# Patient Record
Sex: Female | Born: 1985 | Race: White | Hispanic: No
Health system: Southern US, Community
[De-identification: ages and names within clinical notes are randomized; demographics above are authoritative.]

## PROBLEM LIST (undated history)

## (undated) DIAGNOSIS — F329 Major depressive disorder, single episode, unspecified: Secondary | ICD-10-CM

## (undated) DIAGNOSIS — G4700 Insomnia, unspecified: Secondary | ICD-10-CM

## (undated) DIAGNOSIS — F32A Depression, unspecified: Secondary | ICD-10-CM

## (undated) DIAGNOSIS — E119 Type 2 diabetes mellitus without complications: Secondary | ICD-10-CM

## (undated) DIAGNOSIS — F419 Anxiety disorder, unspecified: Secondary | ICD-10-CM

## (undated) DIAGNOSIS — M5412 Radiculopathy, cervical region: Secondary | ICD-10-CM

## (undated) HISTORY — DX: Insomnia, unspecified: G47.00

## (undated) HISTORY — DX: Anxiety disorder, unspecified: F41.9

## (undated) HISTORY — DX: Depression, unspecified: F32.A

## (undated) HISTORY — DX: Type 2 diabetes mellitus without complications: E11.9

## (undated) HISTORY — DX: Radiculopathy, cervical region: M54.12

---
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
## 2018-07-07 ENCOUNTER — Other Ambulatory Visit: Payer: Self-pay | Admitting: Orthopedic Surgery

## 2018-07-07 ENCOUNTER — Other Ambulatory Visit: Payer: Self-pay | Admitting: Dermatology

## 2018-07-07 ENCOUNTER — Other Ambulatory Visit: Payer: Self-pay | Admitting: Ophthalmology

## 2018-07-07 DIAGNOSIS — M545 Low back pain, unspecified: Secondary | ICD-10-CM

## 2018-07-07 DIAGNOSIS — M5416 Radiculopathy, lumbar region: Secondary | ICD-10-CM

## 2018-07-09 ENCOUNTER — Ambulatory Visit
Admission: RE | Admit: 2018-07-09 | Discharge: 2018-07-09 | Disposition: A | Discharge status: Home / Self Care | Payer: BLUE CROSS/BLUE SHIELD | Source: Ambulatory Visit | Attending: Orthopedic Surgery | Admitting: Orthopedic Surgery

## 2018-07-09 ENCOUNTER — Other Ambulatory Visit: Payer: Self-pay

## 2018-07-09 DIAGNOSIS — M545 Low back pain, unspecified: Secondary | ICD-10-CM

## 2018-07-09 DIAGNOSIS — M5416 Radiculopathy, lumbar region: Secondary | ICD-10-CM

## 2018-07-11 ENCOUNTER — Other Ambulatory Visit: Payer: Self-pay

## 2018-10-15 ENCOUNTER — Ambulatory Visit: Payer: BLUE CROSS/BLUE SHIELD | Admitting: Neurology

## 2018-10-15 ENCOUNTER — Telehealth: Payer: Self-pay

## 2018-10-15 NOTE — Telephone Encounter (Signed)
Pt did not show for their appt with Dr. Athar today.  

## 2018-10-16 ENCOUNTER — Encounter: Payer: Self-pay | Admitting: Neurology

## 2019-09-26 IMAGING — MR MRI LUMBAR SPINE WITHOUT CONTRAST
4 of 5 series · 24 of 48 positions shown · non-contrast
Comparison: Prior radiograph from 05/22/2018 as well as previous
MRI from 04/08/2015

CLINICAL DATA: Initial evaluation for chronic low back pain with
radiation into the left lower extremity for 1 year.

EXAM:
MRI LUMBAR SPINE WITHOUT CONTRAST
TECHNIQUE: Multiplanar, multisequence MR imaging of the lumbar spine was
performed. No intravenous contrast was administered.

[Series 3: T2 · sagittal · 4.0mm · 0.55mm/px · 5 of 13 slices shown (1 of 2)]
[im 1/13]
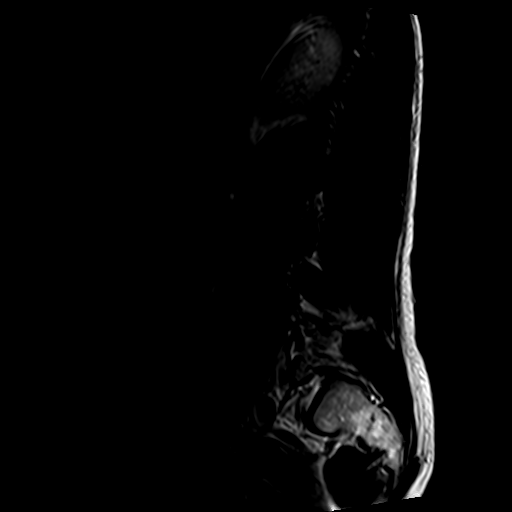
[im 4/13]
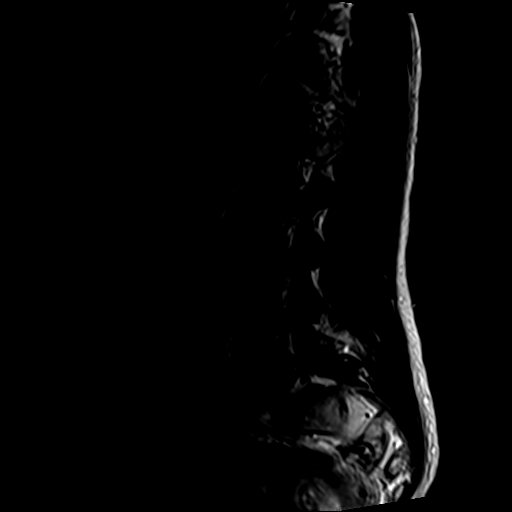
[im 7/13]
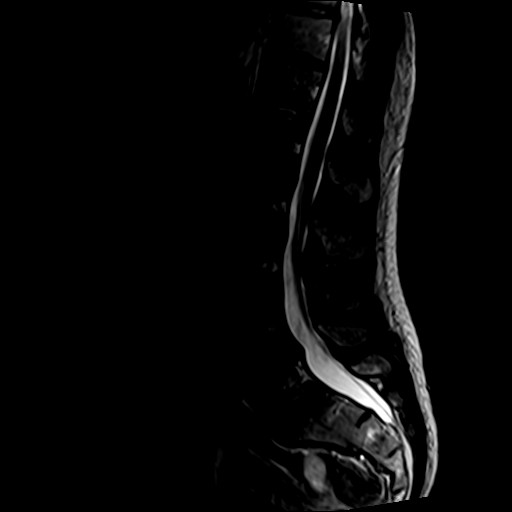
[im 10/13]
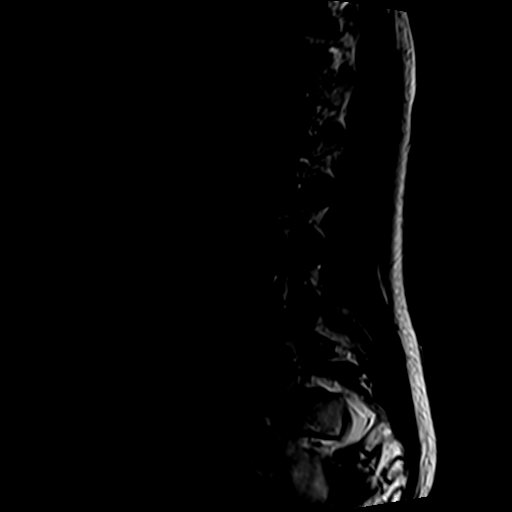
[im 13/13]
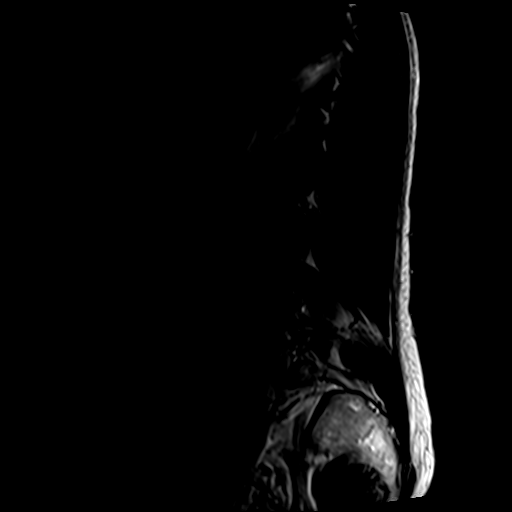

[Series 5: T1 · sagittal · 4.0mm · 0.55mm/px · 6 of 13 slices shown (1 of 2)]
[im 1/13]
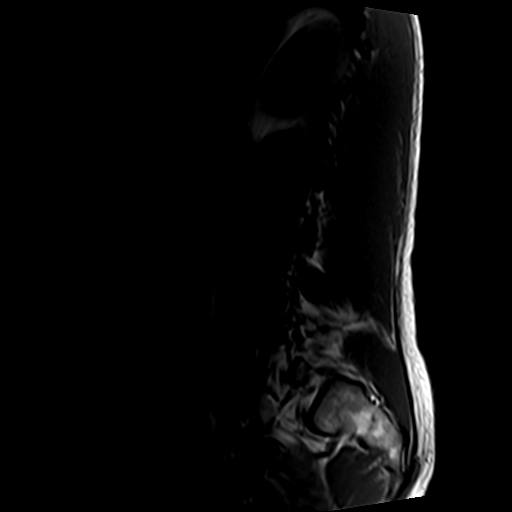
[im 3/13]
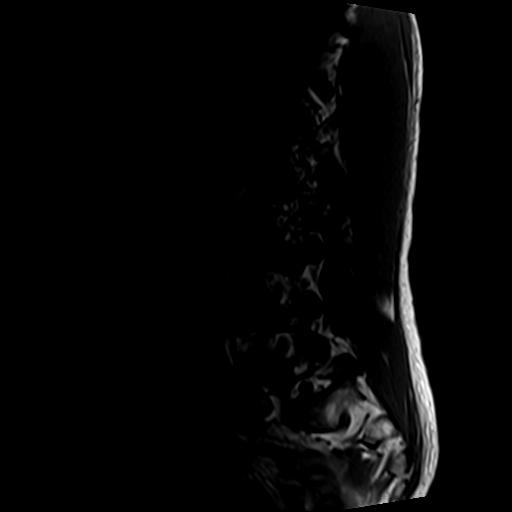
[im 5/13]
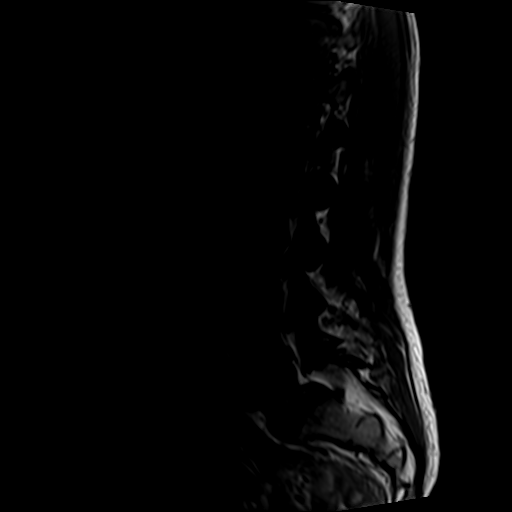
[im 8/13]
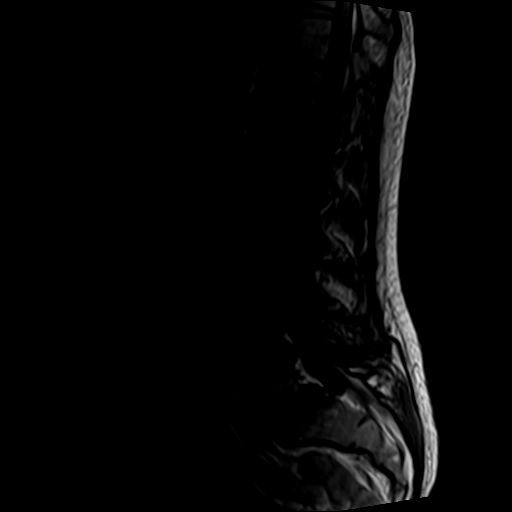
[im 10/13]
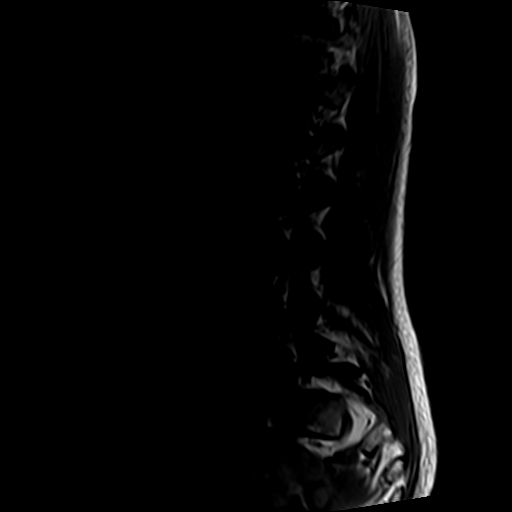
[im 13/13]
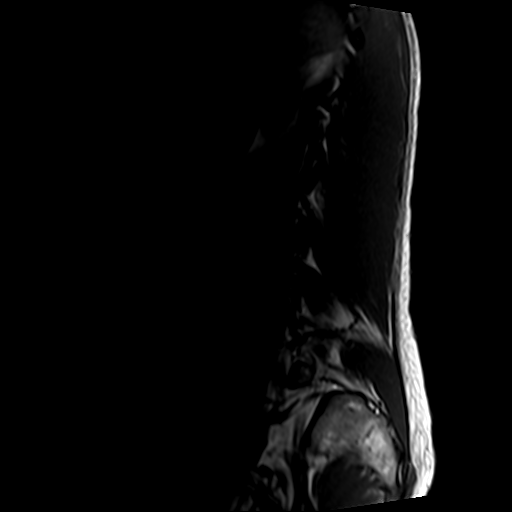

[Series 6: T2 · axial · 4.0mm · 0.70mm/px · z∈[-78,+136]mm · 10 of 36 slices shown (2 of 2)]
[im 3/36]
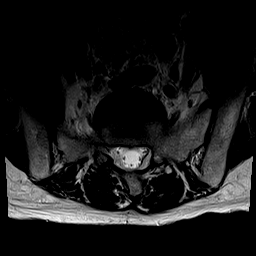
[im 5/36]
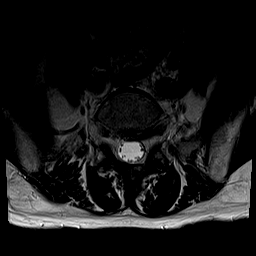
[im 8/36]
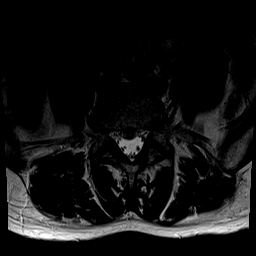
[im 12/36]
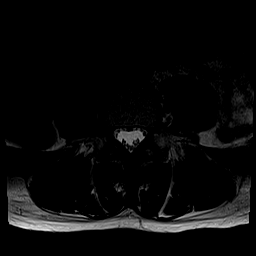
[im 17/36]
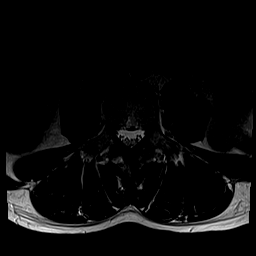
[im 19/36]
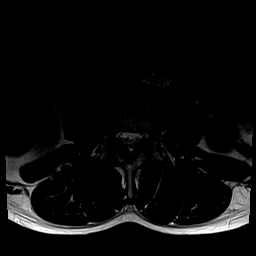
[im 22/36]
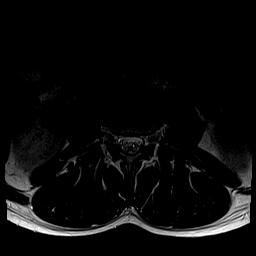
[im 26/36]
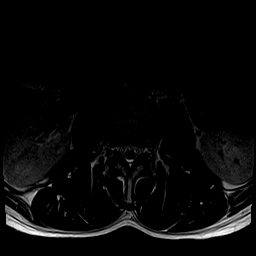
[im 31/36]
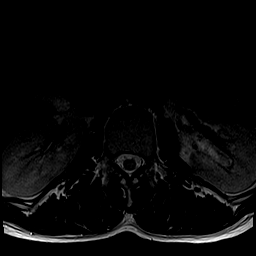
[im 36/36]
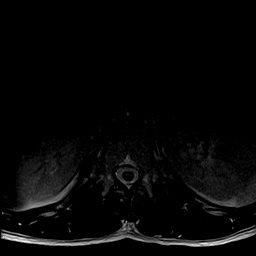

[Series 7: T1 · axial · 4.0mm · 0.35mm/px · z∈[-69,+110]mm · 3 of 36 slices shown (2 of 2)]
[im 5/36]
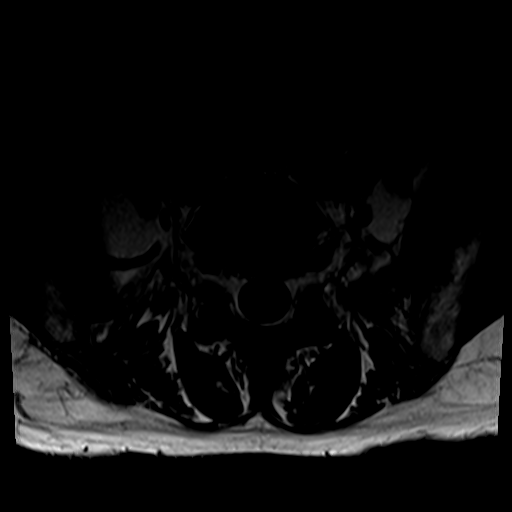
[im 19/36]
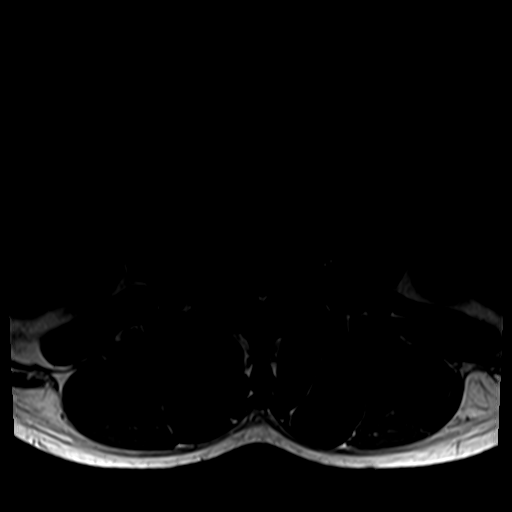
[im 31/36]
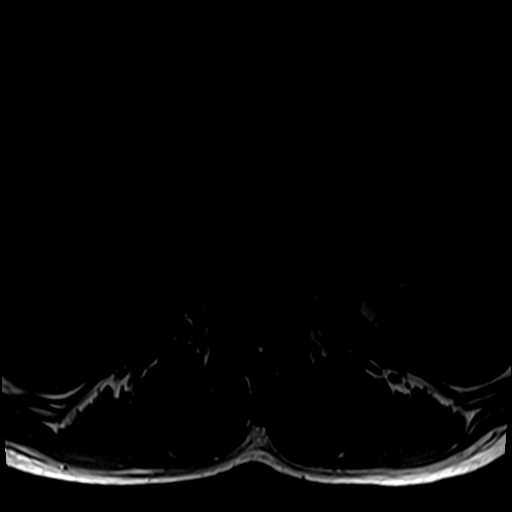

[24 of 48 positions shown; findings below may reference images not displayed]

FINDINGS: Segmentation: Standard. Lowest well-formed disc labeled the L5-S1
level.

Alignment: Chronic bilateral pars defects at L5 with associated 5 mm
spondylolisthesis, relatively stable from previous. Trace 3 mm
retrolisthesis of L4 on L5, mildly progressed. Alignment otherwise
normal with preservation of the normal lumbar lordosis.

Vertebrae: Vertebral body heights maintained without evidence for
acute or chronic fracture. Bone marrow signal intensity within
normal limits. No discrete or worrisome osseous lesions. No abnormal
marrow edema.

Conus medullaris and cauda equina: Conus extends to the inferior L2
level. Conus and cauda equina appear normal.

Paraspinal and other soft tissues: Paraspinous soft tissues within
normal limits. Visualized visceral structures unremarkable.

Disc levels:

T10-11: Seen only on sagittal projection. Tiny central disc
protrusion mildly indents the ventral thecal sac. No significant
stenosis.

T11-12: Unremarkable.

T12-L1: Unremarkable.

L1-2:  Unremarkable.

L2-3: Negative interspace. Mild facet and ligament flavum
hypertrophy. Borderline mild spinal stenosis. Foramina remain
patent. No impingement.

L3-4: Negative interspace. Mild bilateral facet and ligament flavum
hypertrophy. No significant canal or foraminal stenosis.

L4-5: Disc desiccation. Superimposed broad-based central disc
protrusion indents the ventral thecal sac (series 6, image 28).
Protruding disc closely approximates the descending L5 nerve roots
without frank neural impingement or displacement. Superimposed mild
facet hypertrophy. Resultant mild bilateral lateral recess
narrowing. Central canal remains widely patent. No significant
foraminal encroachment.

L5-S1: Chronic bilateral pars defects with associated 5 mm
spondylolisthesis. Associated broad posterior pseudo disc
bulge/uncovering, asymmetric to the left. Bilateral facet
hypertrophy. No significant canal or lateral recess narrowing.
Superimposed shallow left foraminal/extraforaminal disc protrusion
contacts the exiting left L5 nerve root as it courses of the left
neural foramen (series 7, image 33). Superimposed small annular
fissure noted as well. Mild left L5 foraminal stenosis.
IMPRESSION: 1. Shallow left foraminal/extraforaminal disc protrusion at L5-S1,
closely approximating and potentially affecting the exiting left L5
nerve root.
2. Broad-based central disc protrusion at L4-5 with resultant mild
bilateral lateral recess stenosis. No frank neural impingement.
3. Chronic bilateral pars defects at L5 with associated 5 mm
spondylolisthesis of L5 on S1.
4. Tiny central disc protrusion at T10-11 without significant
stenosis.
